# Patient Record
Sex: Female | Born: 1950 | Race: Black or African American | Hispanic: No | Smoking: Former smoker
Health system: Southern US, Community
[De-identification: ages and names within clinical notes are randomized; demographics above are authoritative.]

## PROBLEM LIST (undated history)

## (undated) DIAGNOSIS — K219 Gastro-esophageal reflux disease without esophagitis: Secondary | ICD-10-CM

## (undated) DIAGNOSIS — I10 Essential (primary) hypertension: Secondary | ICD-10-CM

## (undated) DIAGNOSIS — E785 Hyperlipidemia, unspecified: Secondary | ICD-10-CM

## (undated) DIAGNOSIS — N200 Calculus of kidney: Secondary | ICD-10-CM

---

## 2003-07-16 ENCOUNTER — Ambulatory Visit (HOSPITAL_COMMUNITY): Admission: RE | Admit: 2003-07-16 | Discharge: 2003-07-16 | Payer: Self-pay | Admitting: Gastroenterology

## 2003-07-16 ENCOUNTER — Encounter (INDEPENDENT_AMBULATORY_CARE_PROVIDER_SITE_OTHER): Payer: Self-pay | Admitting: Specialist

## 2006-11-20 ENCOUNTER — Ambulatory Visit (HOSPITAL_COMMUNITY): Admission: RE | Admit: 2006-11-20 | Discharge: 2006-11-20 | Payer: Self-pay | Admitting: Gastroenterology

## 2006-11-20 ENCOUNTER — Encounter (INDEPENDENT_AMBULATORY_CARE_PROVIDER_SITE_OTHER): Payer: Self-pay | Admitting: Gastroenterology

## 2007-03-29 ENCOUNTER — Encounter: Admission: RE | Admit: 2007-03-29 | Discharge: 2007-03-29 | Payer: Self-pay | Admitting: Urology

## 2007-03-30 ENCOUNTER — Ambulatory Visit (HOSPITAL_BASED_OUTPATIENT_CLINIC_OR_DEPARTMENT_OTHER): Admission: RE | Admit: 2007-03-30 | Discharge: 2007-03-30 | Payer: Self-pay | Admitting: Urology

## 2007-03-30 ENCOUNTER — Ambulatory Visit (HOSPITAL_COMMUNITY): Admission: RE | Admit: 2007-03-30 | Discharge: 2007-04-01 | Payer: Self-pay | Admitting: Urology

## 2007-04-09 ENCOUNTER — Ambulatory Visit (HOSPITAL_COMMUNITY): Admission: RE | Admit: 2007-04-09 | Discharge: 2007-04-09 | Payer: Self-pay | Admitting: Urology

## 2007-04-25 ENCOUNTER — Ambulatory Visit (HOSPITAL_COMMUNITY): Admission: RE | Admit: 2007-04-25 | Discharge: 2007-04-25 | Payer: Self-pay | Admitting: Urology

## 2007-07-10 ENCOUNTER — Emergency Department (HOSPITAL_COMMUNITY): Admission: EM | Admit: 2007-07-10 | Discharge: 2007-07-10 | Payer: Self-pay | Admitting: Emergency Medicine

## 2007-07-12 ENCOUNTER — Ambulatory Visit (HOSPITAL_COMMUNITY): Admission: RE | Admit: 2007-07-12 | Discharge: 2007-07-12 | Payer: Self-pay | Admitting: Urology

## 2008-03-21 ENCOUNTER — Ambulatory Visit (HOSPITAL_BASED_OUTPATIENT_CLINIC_OR_DEPARTMENT_OTHER): Admission: RE | Admit: 2008-03-21 | Discharge: 2008-03-21 | Payer: Self-pay | Admitting: Urology

## 2008-07-11 ENCOUNTER — Ambulatory Visit (HOSPITAL_BASED_OUTPATIENT_CLINIC_OR_DEPARTMENT_OTHER): Admission: RE | Admit: 2008-07-11 | Discharge: 2008-07-11 | Payer: Self-pay | Admitting: Urology

## 2008-10-30 ENCOUNTER — Ambulatory Visit (HOSPITAL_COMMUNITY): Admission: RE | Admit: 2008-10-30 | Discharge: 2008-10-30 | Payer: Self-pay | Admitting: Urology

## 2009-08-09 IMAGING — CR DG CHEST 2V
2 series · 2 of 2 positions shown · non-contrast
Comparison: 03/29/2007

CLINICAL DATA: Preop, right distal ureteral stone

CHEST - 2 VIEW

[view not recorded (1 of 2)]
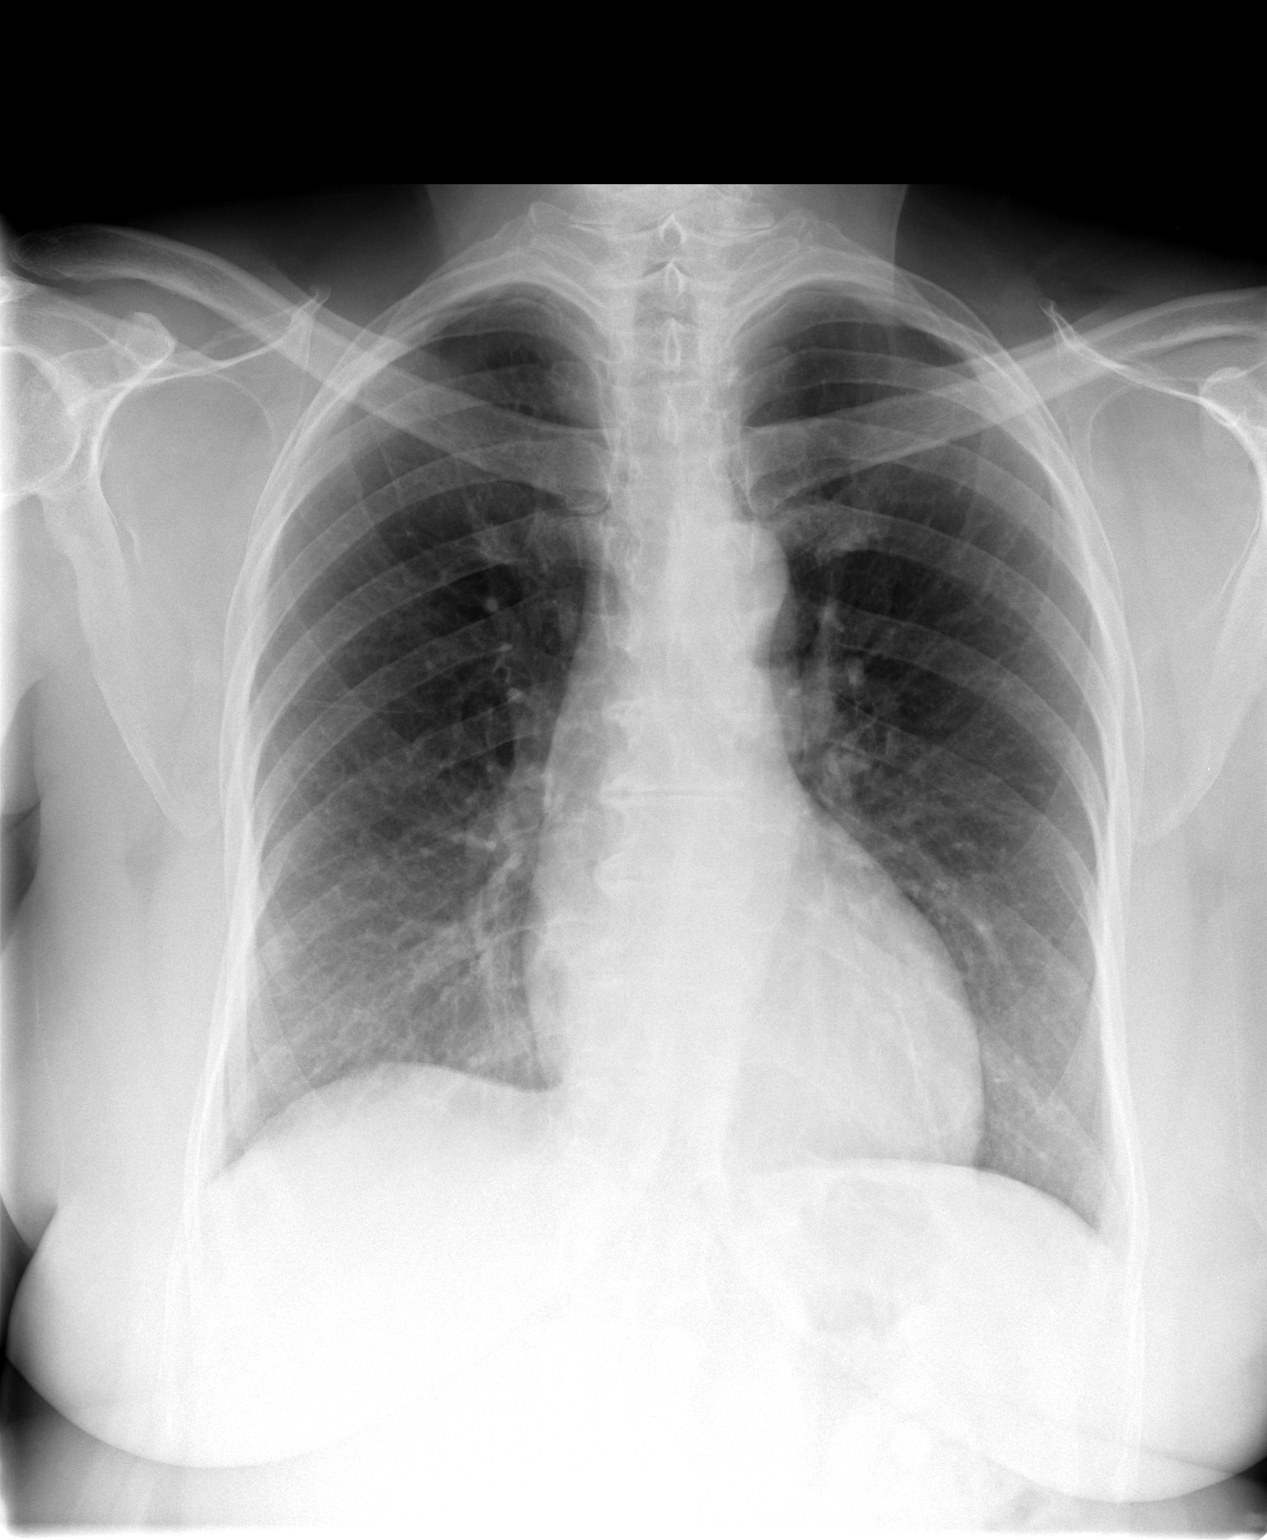

[view not recorded (2 of 2)]
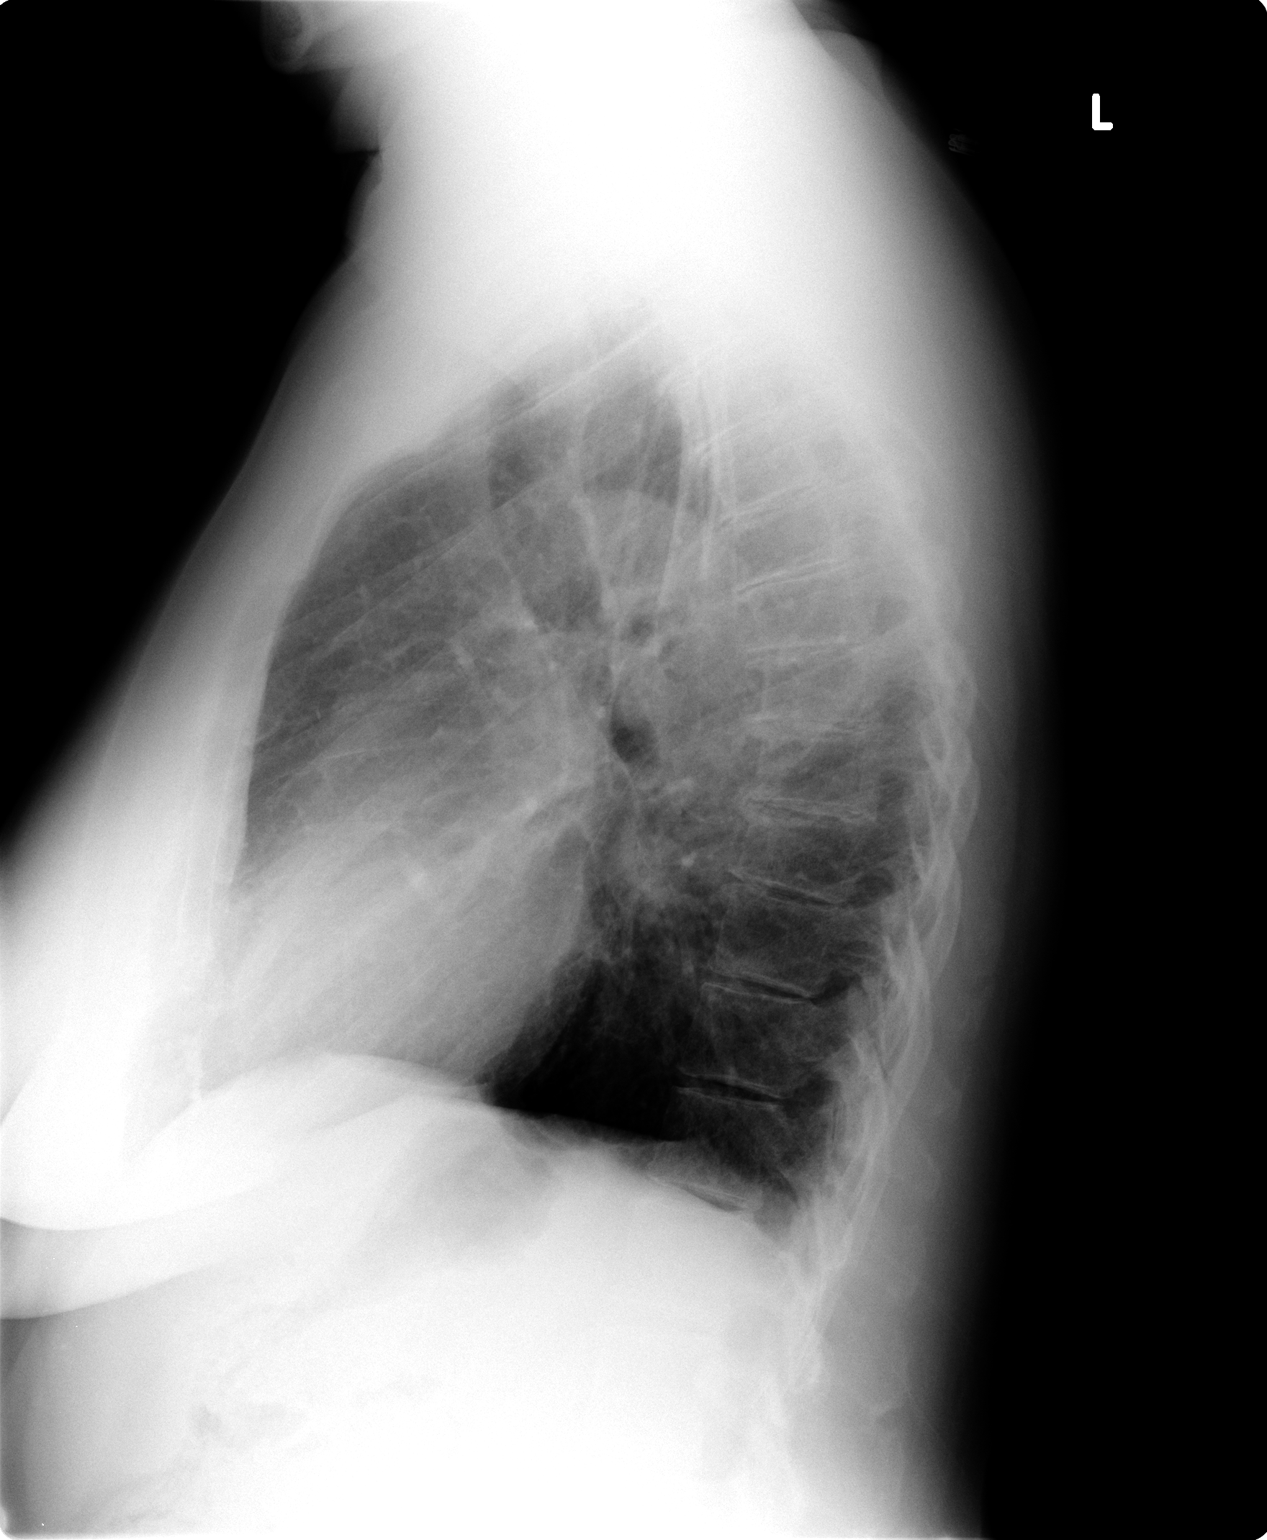

[2 of 2 positions shown; findings below may reference images not displayed]

FINDINGS: Cardiomediastinal silhouette is stable.  No acute
infiltrate or pleural effusion.  No pulmonary edema.  Degenerative
changes thoracic spine are noted.
IMPRESSION: No acute disease.  Degenerative changes thoracic spine.

## 2009-09-28 ENCOUNTER — Ambulatory Visit (HOSPITAL_COMMUNITY): Admission: RE | Admit: 2009-09-28 | Discharge: 2009-09-29 | Payer: Self-pay | Admitting: Urology

## 2010-05-27 ENCOUNTER — Ambulatory Visit (HOSPITAL_COMMUNITY)
Admission: RE | Admit: 2010-05-27 | Discharge: 2010-05-27 | Payer: Self-pay | Source: Home / Self Care | Attending: Urology | Admitting: Urology

## 2010-08-10 LAB — CBC
HCT: 39.5 % (ref 36.0–46.0)
Hemoglobin: 12.9 g/dL (ref 12.0–15.0)
MCHC: 32.6 g/dL (ref 30.0–36.0)
MCV: 80.8 fL (ref 78.0–100.0)
Platelets: 225 10*3/uL (ref 150–400)
RBC: 4.89 MIL/uL (ref 3.87–5.11)
RDW: 13.6 % (ref 11.5–15.5)
WBC: 8.9 10*3/uL (ref 4.0–10.5)

## 2010-09-07 LAB — COMPREHENSIVE METABOLIC PANEL
ALT: 12 U/L (ref 0–35)
Albumin: 3.7 g/dL (ref 3.5–5.2)
Alkaline Phosphatase: 78 U/L (ref 39–117)
Calcium: 8.7 mg/dL (ref 8.4–10.5)
Chloride: 104 mEq/L (ref 96–112)
GFR calc Af Amer: >60 mL/min (ref >60)
GFR calc non Af Amer: >60 mL/min (ref >60)
Glucose, Bld: 104 mg/dL — ABNORMAL HIGH (ref 70–99)

## 2010-09-07 LAB — CBC
MCHC: 32.4 g/dL (ref 30.0–36.0)
RBC: 4.91 MIL/uL (ref 3.87–5.11)
RDW: 15 % (ref 11.5–15.5)
WBC: 5.7 10*3/uL (ref 4.0–10.5)

## 2010-09-07 LAB — APTT: aPTT: 38 seconds — ABNORMAL HIGH (ref 24–37)

## 2010-10-05 NOTE — Op Note (Signed)
Stacy Garrett, Stacy Garrett                 ACCOUNT NO.:  [id number]   MEDICAL RECORD NO.:  [id number]          PATIENT TYPE:  AMB   LOCATION:  [facility]                         FACILITY:  [facility]   PHYSICIAN:  Ronald L. Earlene Plater, M.D.  DATE OF BIRTH:  1951/01/12   DATE OF PROCEDURE:  07/11/2008  DATE OF DISCHARGE:                               OPERATIVE REPORT   DIAGNOSES:  Right nephrolithiasis with ureterolithiasis.   POSTOPERATIVE DIAGNOSES:  Right nephrolithiasis with ureterolithiasis.   OPERATIVE PROCEDURE:  Cystourethroscopy, right retrograde  ureteropyelogram, right flexible and rigid ureterorenoscopy, holmium  laser lithotripsy, basket stone extraction, and placement of right  double-J stent.   SURGEON:  Lucrezia Starch. Earlene Plater, M.D.   ANESTHESIA:  LMA.   ESTIMATED BLOOD LOSS:  Negligible.   TUBES:  24-cm 7-French Contour double pigtail stent.   COMPLICATIONS:  None.   INDICATIONS FOR PROCEDURE:  Ms. Carbonell is a lovely 60 year old black  female who presented with right flank pain, nausea and vomiting.  She  was subsequently found to have an approximately 6-mm fragment blocking  her upper ureter on the right side.  She has been followed with  expulsive therapy utilizing Flomax and fluids, and the stone has not  moved.  She also has significant bilateral nephrolithiasis.  After  continuing for approximately 3 weeks, she has elected to proceed with  the above procedure.   PROCEDURE IN DETAIL:  The patient was placed in the supine position and  after proper LMA anesthesia, was placed in the dorsal lithotomy position  and prepped and draped with Betadine in a sterile fashion.  Cystourethroscopy was performed with the 22.5-French Olympus  panendoscope.  The bladder was examined with 12 and 70-degree lenses,  and was noted to be without lesions.  Both ureteral orifices were in  normal location.  Under fluoroscopic guidance, a 0.03 French sensor wire  was placed in the right renal pelvis.  I could  feel it go by the stone  in the upper ureter.  The ureter was then dilated with the inner  dilating sheath of a short ureteral access sheath, and rigid  ureteroscopy was performed with a 6-French semiflexible ureteroscope.  The stone was seen to flush from the ureteropelvic junction into the  renal caliceal system.  Inspection of the renal pelvis and some  superficial calyces with the scope revealed that I could not see the  fragments.  Therefore, the rigid ureteroscope was removed.  A dual-lumen  inserter was placed and a second 0.03 French sensor wire was placed.  Next, the ureteral access sheath was placed to the upper ureter, and  utilizing the Olympus flexible ureterorenoscope, renoscopy was  performed.  Stone  fragments could be seen in the midline lower pole  caliceal system and several large fragments, one of which we felt was  the flushed fragment from the UPJ.  Utilizing the 200-micron laser  fiber, all visualized stones were fragmented into small fragments and  several small fragments were extracted for stone analysis with the  nitinol basket.  Reinspection revealed there were no significant  fragments, the largest were  approximately 1-2 mm.  No other lesions were  noted to be present.  Dye was injected and a retrograde pyelogram was  performed, and there was no extravasation, although there was  hydronephrosis noted.  The flexible ureterorenoscope was visually  removed, the sheath was removed, and under fluoroscopic guidance a 24-cm  7-French Contour double pigtail stent was placed and noted to be in good  position within the right renal pelvis and within the bladder.  No  pullout string was attached.  Proper location was confirmed  fluoroscopically.  The bladder was drained.  The panendoscope was  removed and the patient was taken to the recovery room stable.      Ronald L. Earlene Plater, M.D.  Electronically Signed     RLD/MEDQ  D:  07/11/2008  T:  07/11/2008  Job:  [id number]

## 2010-10-05 NOTE — Op Note (Signed)
Stacy Garrett, Stacy Garrett                 ACCOUNT NO.:  [id number]   MEDICAL RECORD NO.:  [id number]          PATIENT TYPE:  AMB   LOCATION:  [facility]                         FACILITY[facility]   PHYSICIAN:  Stacy Garrett, M.D.  DATE OF BIRTH:  [birth year]   DATE OF PROCEDURE:  03/21/2008  DATE OF DISCHARGE:                               OPERATIVE REPORT   PREOPERATIVE DIAGNOSIS:  Left ureteral stone.   POSTOPERATIVE DIAGNOSIS:  Left ureteral stone.   PROCEDURES:  1. Cystourethroscopy.  2. Left retrograde pyelogram.  3. Left distal ureteral dilation with access sheath.  4. Left semi-rigid ureteroscopy with laser lithotripsy and basket      extraction of stone fragments.  5. Intraoperative fluoroscopy.  6. Left 6 x 24 contoured double-J stent placement.   ATTENDING PHYSICIAN:  Stacy Garrett.   RESIDENT PHYSICIAN:  Dr. Delman Garrett.   ANESTHESIA:  General.   INDICATIONS FOR PROCEDURE:  Stacy Garrett is a 60 year old white female who  was initially diagnosed with a urinary tract infection by her primary  care physician.  Treatment was begun for this and she was complaining of  right flank pain and had a noncontrast CT scan which showed bilateral  renal calculi and a proximal left ureteral stone that did show any  evidence of what could be causing her right flank pain.  Based on this,  her urinary tract infection was treated with oral antibiotics.  She did  not have a significant amount of discomfort in her left flank, so she  was set up for outpatient elective ureteroscopic stone manipulation.   Prior to the procedure all risks, benefits, consequences and concerns  were discussed and informed consent was obtained.   PROCEDURE IN DETAIL:  The patient was brought to the operating room and  placed in a supine position.  She was correctly identified by her  wristband and an appropriate time-out was taken.  IV antibiotics were  administered and general anesthesia was delivered.  Once adequately  anesthetized, she was placed in the dorsal lithotomy position.  Great  care was taken to minimize the risk of peripheral neuropathy or  compartment syndrome.  Her perineum was prepped and draped sterilely.  We began our procedure by performing cystourethroscopy with a 22-French  rigid cystoscopic sheath, 12-degree lens, and normal saline as our  irrigant.  The urethra was normal in course and caliber.  Upon entering  the bladder, clear urine was identified.  There were no stone fragments  seen in the bladder.  The urothelium was normal.  Both ureteral orifices  were noted to be in their normal anatomic position effluxing clear  urine.  The left ureteral orifice was cannulated with a 6-French open-  ended catheter and left retrograde pyelogram was performed.   Left retrograde pyelogram showed a mobile defect in the distal ureter  that was radio-opaque on fluoroscopy prior to contrast being given.  This was her stone and it was at the level of the UVJ.  There was no  significant hydronephrosis proximal to this.  We then advanced a sensor  tip guidewire through  our open-ended catheter and directed it to the  level of the left renal pelvis with fluoroscopic guidance.  It.  We then  removed the lens from the cystoscope, leaving the sheath behind.  We  then used the 12-French inner dilating sheath from a ureteral access  sheath to gently dilate up the distal ureter.  This was done with  fluoroscopic guidance as well.  We then removed both the sheath and the  access sheath dilator and left the guidewire in place.  We then used a  7.5-French semi-rigid ureteroscope and easily cannulated the left distal  ureter and soon encountered our stone.  We initially grasped it with a  nitinol basket and tried to remove it en bloc, however, it was too large  and there was resistance.  Likewise, we disengaged the basket from the  stone and advanced our 360 nanometer laser fiber through the  ureteroscope and  performed laser lithotripsy with the holmium laser with  settings at 0.5 joules and 5 Hz.  We systematically dusted this stone.  We were able to grasp the larger fragments with a nitinol grasper and  dropped them into the bladder.  Final ureteroscopic evaluation  demonstrated no residual stone in her ureter.  We then removed the  ureteroscope and placed a left 6 x 24 contoured double-J stent without  any difficulty.  Fluoroscopy demonstrated the proximal curl was in the  left renal pelvis and the distal curl was in the bladder.  We then re-  placed the cystoscope into her bladder, verified the position of the  stent.  There was urine emanating from the vents, indicating function.  All of the stone debris was in the dependent part of her bladder and  this was removed through the cystoscope without any difficulty.  The  stone fragments will be sent for analysis.  Her bladder was drained, and  this concluded our procedure.  She tolerated the procedure well.  There  were no complications.  She awoke from anesthesia and was taken to the  PACU in stable condition.  Dr. [name] was present and participated in all  aspects of the case.     ______________________________  Stacy Kitten, MD      Stacy Garrett, M.D.  Electronically Signed    DW/MEDQ  D:  03/21/2008  T:  03/21/2008  Job:  [id number]

## 2010-10-05 NOTE — Op Note (Signed)
NAME:  Stacy Garrett, Stacy Garrett                 ACCOUNT NO.:  [id number]   MEDICAL RECORD NO.:  [id number]          PATIENT TYPE:  AMB   LOCATION:  ENDO                         FACILITY:  [facility]   PHYSICIAN:  Anselmo Rod, M.D.  DATE OF BIRTH:  [birth year]   DATE OF PROCEDURE:  11/20/2006  DATE OF DISCHARGE:                               OPERATIVE REPORT   PROCEDURE PERFORMED:  Colonoscopy with snare polypectomy x 1 and cold  biopsies x 1.   ENDOSCOPIST:  Anselmo Rod, M.D.   INSTRUMENT USED:  Pentax video colonoscope.   INDICATIONS FOR PROCEDURE:  A 60 year old [place] American female with a  family history of colon cancer in her mother.  Undergoing screening  colonoscopy to rule out colonic polyps, masses, etcetera.   PREPROCEDURE PREPARATION:  Informed consent was procured from the  patient. The patient was fasted for 8 hours prior to the procedure and  prepped with 32 OsmoPrep pills the night prior to the procedure. The  risks and benefits of the procedure including a 10% missed rate of  cancer and polyps were discussed with the patient as well.   PREPROCEDURE PHYSICAL:  VITAL SIGNS: The patient with stable vital  signs.  NECK:  Supple.  CHEST:  Clear to auscultation.  HEART:  S1, S2 regular.  ABDOMEN:  Soft with normal bowel sounds.   DESCRIPTION OF THE PROCEDURE:  The patient was placed in left lateral  decubitus position and sedated with 100 mcg of Fentanyl and 10 mg of  Versed given intravenously in slow incremental doses. Once the patient  was adequately sedated and maintained on low-flow oxygen and continuous  cardiac monitoring;  the Pentax video colonoscope was advanced from the  rectum to the cecum without difficulty.  The left colon and the  transverse colon seemed well cleaned-out, but there was some residual  stool in the right colon. There was a large diverticulum with  inspissated in it, noted just distal to the cecum.  A small sessile  polyp was removed by cold  snare from the cecum; and one small sessile  polyp was biopsied (stool biopsies x1) from the cecum as well. There  were a few early right-sided diverticula noted.  The terminal ileum  appeared healthy and without lesions.  The transverse colon and the left  colon including the rectosigmoid colon appeared normal. Retroflexion in  the rectum revealed small internal hemorrhoids. There were small  external hemorrhoids on anal inspection.  Multiple washes were done on  the right side, small lesions could be missed.   IMPRESSION:  1. Two polyps removed from the cecum, one by cold snare, one by cold      biopsy.  2. Right-sided diverticulosis.  3. Small internal and external hemorrhoids.  4. Normal terminal ileum.   RECOMMENDATIONS:  1. Await pathology results.  2. Avoid all nonsteroidals including aspirin for the next 2 weeks.  3. Repeat colonoscopy depending on pathology results.  4. A high fiber diet has been advised and brochures have been given      for education along with brochures on diverticulosis.  5. Outpatient follow up as need arises in the future.      Anselmo Rod, M.D.  Electronically Signed     JNM/MEDQ  D:  11/20/2006  T:  11/20/2006  Job:  [id number]   cc:   Gabriel Earing, M.D.  Fax: [phone]

## 2010-10-05 NOTE — Op Note (Signed)
Stacy Garrett, Stacy Garrett                 ACCOUNT NO.:  [id number]   MEDICAL RECORD NO.:  [id number]          PATIENT TYPE:  AMB   LOCATION:  DAY                          FACILITY:  [facility]   PHYSICIAN:  Terie Purser, MD         DATE OF BIRTH:  03/16/1951   DATE OF PROCEDURE:  04/25/2007  DATE OF DISCHARGE:                               OPERATIVE REPORT   RESIDENT:  Dr. Rachel Bo.   PREOPERATIVE DIAGNOSIS:  Right impacted ureteral stone.   POSTOPERATIVE DIAGNOSIS:  Right impacted ureteral stone.   OPERATION PERFORMED:  1. Pancystourethroscopy.  2. Removal of right ureteral stent.  3. Right laser lithotripsy of impacted right ureteral stone.  4. Right basket stone extraction.  5. Right retrograde pyelogram.  6. Right ureteral stent placement.   SURGEON:  Lucrezia Starch. Earlene Plater, M.D.   ANESTHESIA:   INDICATIONS FOR PROCEDURE:  This is a 60 year old female who initially  presented with a right impacted ureteral stone.  Given that she was ill  at the time of presentation, a stent was attempted to be placed for  drainage of her kidney; however, attempts were unsuccessful, given stone  impaction.  Ultimately, a nephrostomy tube was placed on the right side  and an antegrade stent.  Giving time for sufficient dilation, she has  been brought back electively for right-sided laser lithotripsy of the  stone.   DESCRIPTION OF PROCEDURE:  The patient was brought back into the  operating room.  After the successful induction of anesthetic,  performing a preoperative time out, and receiving preoperative  antibiotics, the patient was placed in the dorsal lithotomy position.  Please note all pressure points were padded appropriately.  She was then  prepped and draped in the usual sterile fashion.   A 22 [place] and a 12.5 degree lenses were used to do  pancystourethroscopy.  The patient's urethra appeared normal.  There was  no evidence of stricture, foreign body, tumor or any other abnormality.  On  entering the patient's bladder, a stent could be seen emanating from  the right ureteral orifice.  Both the right and left ureteral orifices  were at the lateral aspects of the trigone effluxing clear urine.  There  were no other bladder abnormalities.  Specifically no stones, no foreign  bodies, no tumors or diverticula.  At this point the stent was grasped  and the distal portion of the stent was brought out of the urethral  meatus.  It was cannulated with a Glide wire.  With the Glide wire in  the upper collecting system, verified with fluoroscopy, the stent was  removed.  The Glide wire was used as a safety wire and secured to the  patient's draping.  At this point a semirigid ureteroscope was used.   With the semirigid ureteroscope, ureteroscopy was performed.  An area of  impaction along the upper portion of the ureter with a stone just  proximal to this was verified.  With some manipulation, we were able to  guide laser into the field and systematically, we lasered the stone to  break it up into smaller pieces.  Once we had broken up the stone into  adequate pieces, we used a basket to extract the stones from the ureter.  Multiple passes were taken to remove all stones.  Some were sent for  analysis.   Once basketing had been completed, we advanced the semirigid  ureteroscope up to the level of the pelvis into the upper caliceal  collecting system.  We saw some small submillimeter fragments of sand  from breaking up the stone but there were no large stone fragments  visible in the upper collecting system and the caliceal system around  the ureter.   At this point we turned our attention to the retrograde pyelogram.  After removing the scope, the end hole catheter was advanced over the  safety Glide wire.  A retrograde pyelogram was shot.  There was no  evidence of filling defects or extravasation.  At this point the wire  was reinserted.  End hole catheter was removed and a 6 x  24 French stent  was placed.   The 6 x 24 French stent was advanced over the [employer] wire.  Good proximal  and distal curls were seen.  These were viewed fluoroscopically.  The  bladder was emptied.  All stone fragments in the bladder were removed  and this ended the procedure.  Please note that Dr. Earlene Plater was present  throughout the entirety of the case.  The estimated blood loss was  minimal.   URINE OUTPUT:  None recorded.   DRAINS:  6 x 24 French right ureteral stent.   COMPLICATIONS:  None apparent.   DISPOSITION:  The patient was brought to PACU for further care.      Terie Purser, MD     JH/MEDQ  D:  04/25/2007  T:  04/26/2007  Job:  [id number]

## 2010-10-05 NOTE — Op Note (Signed)
NAMESAQUOIA, SIANEZ                 ACCOUNT NO.:  [id number]   MEDICAL RECORD NO.:  [id number]          PATIENT TYPE:  AMB   LOCATION:  XRAY                         [facility]ACILITY:  [facility]   PHYSICIAN:  Ronald L. Earlene Plater, M.D.  DATE OF BIRTH:  August 05, 1950   DATE OF PROCEDURE:  03/30/2007  DATE OF DISCHARGE:                               OPERATIVE REPORT   PREOPERATIVE DIAGNOSIS:  Right ureterolithiasis with hydronephrosis and  a urinary tract infection.   POSTOPERATIVE DIAGNOSIS:  Right ureterolithiasis with hydronephrosis and  a urinary tract infection.   PROCEDURE:  Cystourethroscopy, right retrograde ureteral pyelogram.   SURGEON:  Ronald L. Earlene Plater, M.D.   ANESTHESIA:  LMA.   BLOOD LOSS:  Negligible.   TUBES:  None.   COMPLICATIONS:  None.   FINDINGS:  Impacted stones at L2 level could not be traversed.   PROCEDURE:  Ms. Mcgaughey is a lovely 60 year old black female who has  passed stones and has bilateral nephrolithiasis.  She had been having  hematuria for approximately 2 weeks without nausea.  She was  subsequently found to have a urinary tract infection which was treated,  and she underwent a CT scan which revealed two stones with a marked  hydroureteronephrosis with a 5 and a 6-mm impacted stone.  After  understanding the risks, benefits, and alternatives, she elected to  undergo the above procedure with attempted ureteroscopy.   PROCEDURE IN DETAIL:  The patient was placed in the supine position.  After proper LMA anesthesia, she was placed in the dorsal lithotomy  position with exaggerated lithotomy and prepped and draped with Betadine  in a sterile fashion.   Cystourethroscopy was performed with a 22.5 [place] Olympus panendoscope.  The bladder was carefully inspected and noted be without lesions.  Efflux of urine was noted from the left ureteral orifice.  A sensor wire  was placed up to the right stones which could be seen on fluoroscopy,  and it could not be passed beyond  it.  A 6-French open-ended catheter  was passed up it.  Dye was injected, and no dye could traverses the  stones.  Xylocaine jelly was then injected.  A Glidewire was utilized,  and a Patent attorney catheter was utilized, and nothing could be  traversed beyond the stone.  It was felt to be highly impacted, and any  further manipulation would be dangerous.   The patient tolerated the procedure well and was taken to the recovery  room stable.  Plan is to perform a right percutaneous nephrostomy today  and possibly an antegrade double-J stent and attempt ureteroscopy at a  later date versus open ureterolithotomy.      Ronald L. Earlene Plater, M.D.  Electronically Signed     RLD/MEDQ  D:  03/30/2007  T:  03/30/2007  Job:  [id number]

## 2010-10-08 NOTE — Op Note (Signed)
NAME:  Stacy Garrett, Stacy Garrett                             ACCOUNT NO.:  [id number]   MEDICAL RECORD NO.:  [id number]                   PATIENT TYPE:  AMB   LOCATION:  ENDO                                 FACILITY:  [facility]   PHYSICIAN:  Anselmo Rod, M.D.               DATE OF BIRTH:  04/18/1951   DATE OF PROCEDURE:  07/16/2003  DATE OF DISCHARGE:                                 OPERATIVE REPORT   PROCEDURE PERFORMED:  Colonoscopy with left colon biopsies.   ENDOSCOPIST:  Charna Elizabeth, M.D.   INSTRUMENT USED:  Olympus video colonoscope.   INDICATIONS FOR PROCEDURE:  The patient is a 60 year old African-American  female undergoing screening colonoscopy.  The patient has had problem with  rectal bleeding and constipation.  Rule out colonic polyps, masses, etc.   PREPROCEDURE PREPARATION:  Informed consent was procured from the patient.  The patient was fasted for eight hours prior to the procedure and prepped  with a bottle of magnesium citrate and a gallon of GoLYTELY the night prior  to the procedure.   PREPROCEDURE PHYSICAL:  The patient had stable vital signs.  Neck supple.  Chest clear to auscultation.  S1 and S2 regular.  Abdomen soft with normal  bowel sounds.   DESCRIPTION OF PROCEDURE:  The patient was placed in left lateral decubitus  position and sedated with 70 mg of Demerol and 7 mg of Versed in slow  incremental doses.  Once the patient was adequately sedated and maintained  on low flow oxygen and continuous cardiac monitoring, the Olympus video  colonoscope was advanced from the rectum to the cecum with some difficulty.  There was some residual stool in the colon and multiple washes were done.  No masses, polyps, erosions, ulcerations or diverticula were seen.  There  was mild friability of the mucosa in the left colon with loss of vascular  markings.  Biopsies were done to rule out colitis.  Small internal  hemorrhoids were seen on retroflexion.  The appendicular orifice  and  ileocecal valve were clearly visualized.  An isolated diverticulum was seen  in the right colon with inspissated stool in the diverticula.  The terminal  ileum appeared healthy without lesions.  The patient tolerated the procedure  well without immediate complications.   IMPRESSION:  1. Mild friability with loss of vascular markings in the left colonic     mucosa, biopsies done.  2. Small internal hemorrhoids.  3. Isolated diverticulum in midright colon with inspissated stool.  4. Normal terminal ileum.  5. No masses or polyps seen.   RECOMMENDATIONS:  1. Continue high fiber diet with liberal fluid intake.  2. Outpatient follow-up in the next two weeks to follow up on pathology     results.  Further recommendations to be made thereafter.  Anselmo Rod, M.D.    JNM/MEDQ  D:  07/16/2003  T:  07/16/2003  Job:  [id number]   cc:   Gabriel Earing, M.D.  [address]  [place]  [place] [address]  Fax: [phone]

## 2011-02-11 LAB — COMPREHENSIVE METABOLIC PANEL WITH GFR
ALT: 18
AST: 24
CO2: 22
Chloride: 102
GFR calc Af Amer: >60
GFR calc non Af Amer: 58 — ABNORMAL LOW
Sodium: 138
Total Bilirubin: 0.5

## 2011-02-11 LAB — BASIC METABOLIC PANEL
BUN: 16
Calcium: 8.7
Chloride: 104
Creatinine, Ser: 0.85
GFR calc Af Amer: >60

## 2011-02-11 LAB — DIFFERENTIAL
Basophils Absolute: 0
Basophils Relative: 0
Eosinophils Absolute: 0.1
Eosinophils Relative: 1
Lymphocytes Relative: 18
Lymphs Abs: 2.1
Monocytes Absolute: 0.4
Monocytes Relative: 4
Neutro Abs: 9.3 — ABNORMAL HIGH
Neutrophils Relative %: 78 — ABNORMAL HIGH

## 2011-02-11 LAB — URINALYSIS, ROUTINE W REFLEX MICROSCOPIC
Bilirubin Urine: NEGATIVE
Glucose, UA: NEGATIVE
Ketones, ur: NEGATIVE
Nitrite: NEGATIVE
Protein, ur: NEGATIVE
Specific Gravity, Urine: 1.017
Urobilinogen, UA: 0.2
pH: 6.5

## 2011-02-11 LAB — CBC
HCT: 42.2
Hemoglobin: 14.2
MCHC: 33.8
MCV: 78.2
Platelets: 232
RBC: 5.4 — ABNORMAL HIGH
RDW: 14.4
WBC: 11.9 — ABNORMAL HIGH

## 2011-02-11 LAB — URINE MICROSCOPIC-ADD ON

## 2011-02-11 LAB — COMPREHENSIVE METABOLIC PANEL
Albumin: 4
Alkaline Phosphatase: 90
BUN: 17
Calcium: 9.2
Creatinine, Ser: 0.99
Glucose, Bld: 129 — ABNORMAL HIGH
Potassium: 2.9 — ABNORMAL LOW
Total Protein: 8.1

## 2011-02-21 LAB — POCT I-STAT 4, (NA,K, GLUC, HGB,HCT)
Hemoglobin: 13.6
Sodium: 144

## 2011-02-28 LAB — HEMOGLOBIN AND HEMATOCRIT, BLOOD
HCT: 39.2
Hemoglobin: 13.2

## 2011-02-28 LAB — BASIC METABOLIC PANEL
CO2: 29
Chloride: 102
GFR calc Af Amer: >60
Potassium: 3.3 — ABNORMAL LOW

## 2011-03-01 LAB — CBC
HCT: 39.7
Hemoglobin: 11.5 — ABNORMAL LOW
Hemoglobin: 13
MCHC: 32.6
RBC: 5
RDW: 13.5

## 2011-03-01 LAB — COMPREHENSIVE METABOLIC PANEL
ALT: 26
Alkaline Phosphatase: 109
CO2: 34 — ABNORMAL HIGH
Chloride: 102
GFR calc non Af Amer: >60
Glucose, Bld: 111 — ABNORMAL HIGH
Potassium: 4
Sodium: 144
Total Bilirubin: 0.4

## 2011-03-01 LAB — BASIC METABOLIC PANEL
BUN: 18
CO2: 29
Calcium: 8.2 — ABNORMAL LOW
Glucose, Bld: 94
Sodium: 143

## 2011-03-01 LAB — URINALYSIS, ROUTINE W REFLEX MICROSCOPIC
Glucose, UA: NEGATIVE
Ketones, ur: NEGATIVE
Nitrite: NEGATIVE
Protein, ur: NEGATIVE
Urobilinogen, UA: 0.2

## 2011-06-25 IMAGING — CR DG ABDOMEN 1V
1 series · 1 of 1 positions shown · non-contrast
Comparison: Abdominal radiograph performed 10/30/2008

CLINICAL DATA: Pre lithotripsy radiograph for right ureteral stone.

ABDOMEN - 1 VIEW

[t abdomen supine]
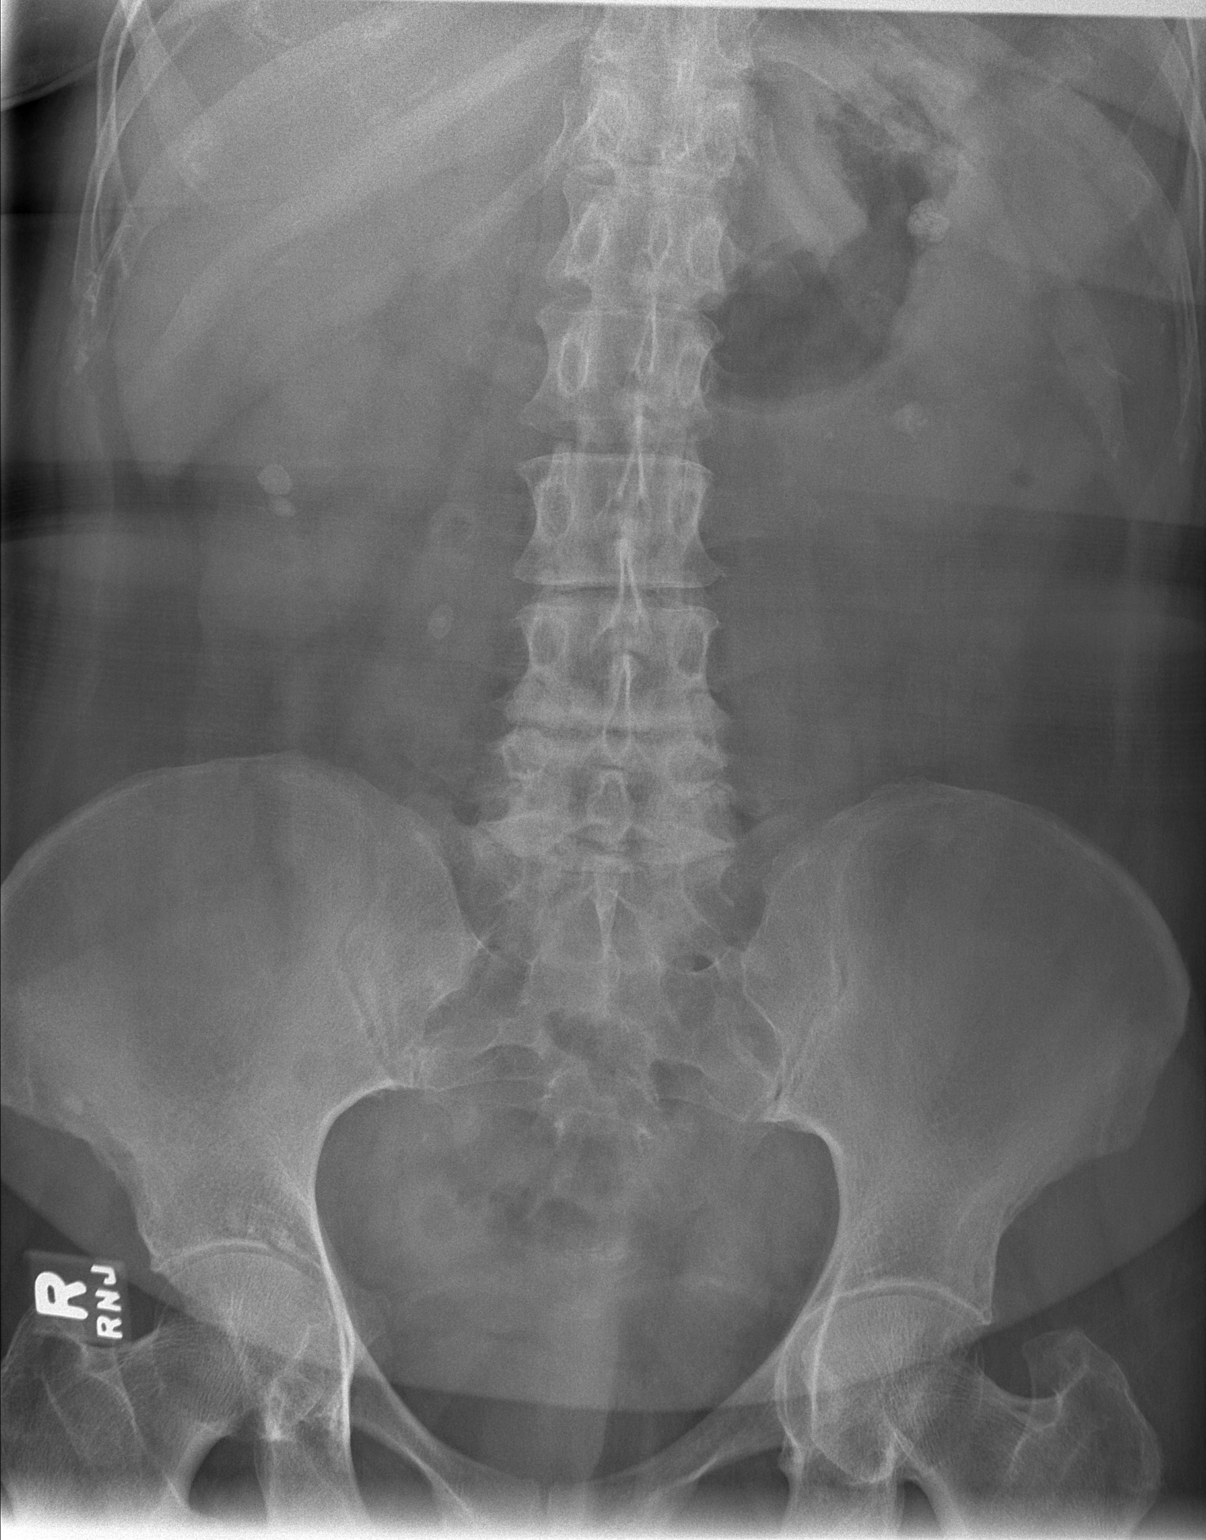

[1 of 1 positions shown; findings below may reference images not displayed]

FINDINGS: Bilateral renal stones are noted, measuring 0.9 cm and
0.7 cm on the right, and 1.2 cm, 1.0 cm and 0.2 cm on the left.
There is an apparent stone at the inferior right renal pelvis,
measuring 1.8 cm in size.  There is also a 0.9 cm stone overlying
the expected location of the proximal right ureter.

The visualized bowel gas pattern is grossly unremarkable; there is
a paucity of bowel gas within the abdomen.  A small amount of air
is noted within the stomach.

Mild degenerative change is noted at the lower lumbar spine.  No
acute osseous abnormalities are seen.
IMPRESSION: 1.  Apparent 1.8 cm stone at the inferior right renal pelvis, and
0.9 cm stone overlying the expected location of the proximal right
ureter.
2.  Bilateral renal stones noted.

## 2014-03-12 ENCOUNTER — Emergency Department (HOSPITAL_BASED_OUTPATIENT_CLINIC_OR_DEPARTMENT_OTHER)
Admission: EM | Admit: 2014-03-12 | Discharge: 2014-03-12 | Disposition: A | Discharge status: Home / Self Care | Payer: BC Managed Care – PPO | Attending: Emergency Medicine | Admitting: Emergency Medicine

## 2014-03-12 ENCOUNTER — Encounter (HOSPITAL_BASED_OUTPATIENT_CLINIC_OR_DEPARTMENT_OTHER): Payer: Self-pay | Admitting: Emergency Medicine

## 2014-03-12 DIAGNOSIS — Z79899 Other long term (current) drug therapy: Secondary | ICD-10-CM | POA: Diagnosis not present

## 2014-03-12 DIAGNOSIS — R55 Syncope and collapse: Secondary | ICD-10-CM | POA: Insufficient documentation

## 2014-03-12 DIAGNOSIS — Z791 Long term (current) use of non-steroidal anti-inflammatories (NSAID): Secondary | ICD-10-CM | POA: Diagnosis not present

## 2014-03-12 DIAGNOSIS — Z792 Long term (current) use of antibiotics: Secondary | ICD-10-CM | POA: Diagnosis not present

## 2014-03-12 DIAGNOSIS — Z87891 Personal history of nicotine dependence: Secondary | ICD-10-CM | POA: Diagnosis not present

## 2014-03-12 DIAGNOSIS — Y9389 Activity, other specified: Secondary | ICD-10-CM | POA: Diagnosis not present

## 2014-03-12 DIAGNOSIS — Y9289 Other specified places as the place of occurrence of the external cause: Secondary | ICD-10-CM | POA: Diagnosis not present

## 2014-03-12 DIAGNOSIS — Z87442 Personal history of urinary calculi: Secondary | ICD-10-CM | POA: Diagnosis not present

## 2014-03-12 DIAGNOSIS — T782XXA Anaphylactic shock, unspecified, initial encounter: Secondary | ICD-10-CM

## 2014-03-12 DIAGNOSIS — I10 Essential (primary) hypertension: Secondary | ICD-10-CM | POA: Diagnosis not present

## 2014-03-12 DIAGNOSIS — T368X5A Adverse effect of other systemic antibiotics, initial encounter: Secondary | ICD-10-CM | POA: Diagnosis not present

## 2014-03-12 DIAGNOSIS — Z8639 Personal history of other endocrine, nutritional and metabolic disease: Secondary | ICD-10-CM | POA: Insufficient documentation

## 2014-03-12 DIAGNOSIS — Z87440 Personal history of urinary (tract) infections: Secondary | ICD-10-CM | POA: Diagnosis not present

## 2014-03-12 DIAGNOSIS — K219 Gastro-esophageal reflux disease without esophagitis: Secondary | ICD-10-CM | POA: Diagnosis not present

## 2014-03-12 DIAGNOSIS — R21 Rash and other nonspecific skin eruption: Secondary | ICD-10-CM | POA: Diagnosis present

## 2014-03-12 DIAGNOSIS — T886XXA Anaphylactic reaction due to adverse effect of correct drug or medicament properly administered, initial encounter: Secondary | ICD-10-CM | POA: Diagnosis not present

## 2014-03-12 HISTORY — DX: Hyperlipidemia, unspecified: E78.5

## 2014-03-12 HISTORY — DX: Calculus of kidney: N20.0

## 2014-03-12 HISTORY — DX: Essential (primary) hypertension: I10

## 2014-03-12 HISTORY — DX: Gastro-esophageal reflux disease without esophagitis: K21.9

## 2014-03-12 LAB — URINALYSIS, ROUTINE W REFLEX MICROSCOPIC
GLUCOSE, UA: NEGATIVE mg/dL
KETONES UR: 15 mg/dL — AB
NITRITE: NEGATIVE
PH: 5.5 (ref 5.0–8.0)
Protein, ur: 100 mg/dL — AB
SPECIFIC GRAVITY, URINE: 1.022 (ref 1.005–1.030)
Urobilinogen, UA: 1 mg/dL (ref 0.0–1.0)

## 2014-03-12 LAB — URINE MICROSCOPIC-ADD ON

## 2014-03-12 MED ORDER — EPINEPHRINE HCL 1 MG/ML IJ SOLN
INTRAMUSCULAR | Status: AC
  Administered 2014-03-12: 1 mg
  Filled 2014-03-12: qty 1

## 2014-03-12 MED ORDER — PREDNISONE 50 MG PO TABS
60.0000 mg | ORAL_TABLET | Freq: Once | ORAL | Status: AC
  Administered 2014-03-12: 60 mg via ORAL
  Filled 2014-03-12 (×2): qty 1

## 2014-03-12 MED ORDER — DIPHENHYDRAMINE HCL 25 MG PO TABS: 25.0000 mg | ORAL_TABLET | Freq: Four times a day (QID) | ORAL | Status: AC

## 2014-03-12 MED ORDER — PREDNISONE 10 MG PO TABS: 60.0000 mg | ORAL_TABLET | Freq: Every day | ORAL | Status: AC

## 2014-03-12 MED ORDER — EPINEPHRINE 0.3 MG/0.3ML IJ SOAJ
0.3000 mg | Freq: Once | INTRAMUSCULAR | Status: DC
  Filled 2014-03-12: qty 0.6

## 2014-03-12 MED ORDER — CEPHALEXIN 500 MG PO CAPS: 500.0000 mg | ORAL_CAPSULE | Freq: Two times a day (BID) | ORAL | Status: AC

## 2014-03-12 MED ORDER — SODIUM CHLORIDE 0.9 % IV BOLUS (SEPSIS)
1000.0000 mL | Freq: Once | INTRAVENOUS | Status: AC
  Administered 2014-03-12: 1000 mL via INTRAVENOUS

## 2014-03-12 MED ORDER — FAMOTIDINE 20 MG PO TABS: 20.0000 mg | ORAL_TABLET | Freq: Two times a day (BID) | ORAL | Status: AC

## 2014-03-12 MED ORDER — EPINEPHRINE 0.3 MG/0.3ML IJ SOAJ: 0.3000 mg | Freq: Once | INTRAMUSCULAR | Status: AC

## 2014-03-12 MED ORDER — FAMOTIDINE IN NACL 20-0.9 MG/50ML-% IV SOLN
20.0000 mg | Freq: Once | INTRAVENOUS | Status: AC
  Administered 2014-03-12: 20 mg via INTRAVENOUS
  Filled 2014-03-12: qty 50

## 2014-03-12 NOTE — ED Notes (Signed)
Pt reports HA as well. Reports that she has had HA all day, but has progressively gotten worse.

## 2014-03-12 NOTE — ED Provider Notes (Signed)
CSN: [id number]     Arrival date & time 03/12/14  [id number] History  This chart was scribed for Stacy Garrett Orlando Thalmann, MD by Evon Slackerrance Branch, ED Scribe. This patient was seen in room MH10/MH10 and the patient's care was started at 6:32 PM.      Chief Complaint  Patient presents with  . Allergic Reaction   Patient is a 63 y.o. female presenting with allergic reaction. The history is provided by the patient. No language interpreter was used.  Allergic Reaction Presenting symptoms: difficulty breathing, itching and rash   Severity:  Moderate Context: medications   Relieved by:  Antihistamines Worsened by:  Nothing tried Ineffective treatments:  Rest  HPI Comments: Stacy Garrett is a 63 y.o. female who presents to the Emergency Department complaining of allergic reaction onset 2:30 PM. She states she went to doctor for UTI and Kidney stones today and received bactrim. She states that she took the bactrim then took a nap and about 2 hours later noticed itching in her hands that spread all over. She states shortly after she started to turn red and develop hives all over. She states when returning to the doctors office she had trouble breathing and a syncope episode in the elevator. She state she received a shot of benadryl that has provided some relief.    Past Medical History  Diagnosis Date  . Hypertension   . GERD (gastroesophageal reflux disease)   . Hyperlipidemia   . Kidney stones    History reviewed. No pertinent past surgical history. No family history on file. History  Substance Use Topics  . Smoking status: Former Games developermoker  . Smokeless tobacco: Not on file  . Alcohol Use: Not on file   OB History   Grav Para Term Preterm Abortions TAB SAB Ect Mult Living                 Review of Systems  Skin: Positive for itching and rash.  Neurological: Positive for syncope.  All other systems reviewed and are negative.   Allergies  Bactrim  Home Medications   Prior to Admission medications    Medication Sig Start Date End Date Taking? Authorizing Provider  cetirizine (ZYRTEC) 10 MG tablet Take 10 mg by mouth daily.   Yes Historical Provider, MD  fluticasone (FLONASE) 50 MCG/ACT nasal spray Place into both nostrils daily.   Yes Historical Provider, MD  lisinopril-hydrochlorothiazide (PRINZIDE,ZESTORETIC) 20-25 MG per tablet Take 1 tablet by mouth daily.   Yes Historical Provider, MD  meloxicam (MOBIC) 15 MG tablet Take 15 mg by mouth daily.   Yes Historical Provider, MD  metoprolol succinate (TOPROL-XL) 100 MG 24 hr tablet Take 200 mg by mouth daily. Take with or immediately following a meal.   Yes Historical Provider, MD  omeprazole (PRILOSEC) 40 MG capsule Take 40 mg by mouth daily.   Yes Historical Provider, MD   Triage Vitals: BP 106/59  Pulse 72  Temp(Src) 97.6 F (36.4 C) (Oral)  Resp 18  Ht 5\' 6"  (1.676 m)  Wt 179 lb (81.194 kg)  BMI 28.91 kg/m2  SpO2 100%  Physical Exam  Nursing note and vitals reviewed. Constitutional: She is oriented to person, place, and time. She appears well-developed and well-nourished. No distress.  HENT:  Head: Normocephalic and atraumatic.  Mouth/Throat: Oropharynx is clear and moist.  Eyes: Conjunctivae are normal. Pupils are equal, round, and reactive to light. No scleral icterus.  Neck: Neck supple.  Cardiovascular: Normal rate, regular rhythm, normal heart sounds and  intact distal pulses.   No murmur heard. Pulmonary/Chest: Effort normal and breath sounds normal. No stridor. No respiratory distress. She has no rales.  Abdominal: Soft. Bowel sounds are normal. She exhibits no distension. There is no tenderness.  Musculoskeletal: Normal range of motion.  Neurological: She is alert and oriented to person, place, and time.  Skin: Skin is warm and dry. No rash noted.  Flushed skin. Mild excoriations.  Psychiatric: She has a normal mood and affect. Her behavior is normal.    ED Course  Procedures (including critical care  time) DIAGNOSTIC STUDIES: Oxygen Saturation is 100% on RA, normla by my interpretation.    COORDINATION OF CARE: 6:44 PM-Discussed treatment plan which includes UA and epinephrine shot with pt at bedside and pt agreed to plan.      Labs Review Labs Reviewed  URINALYSIS, ROUTINE W REFLEX MICROSCOPIC - Abnormal; Notable for the following:    Color, Urine AMBER (*)    APPearance TURBID (*)    Hgb urine dipstick LARGE (*)    Bilirubin Urine SMALL (*)    Ketones, ur 15 (*)    Protein, ur 100 (*)    Leukocytes, UA LARGE (*)    All other components within normal limits  URINE MICROSCOPIC-ADD ON - Abnormal; Notable for the following:    Squamous Epithelial / LPF FEW (*)    Bacteria, UA FEW (*)    All other components within normal limits  URINE CULTURE    Imaging Review No results found.   EKG Interpretation None      MDM   Final diagnoses:  Anaphylaxis, initial encounter      63 year old female who was just started on Bactrim for a urinary tract infection who presents with hives, itching, and syncope. The setting of a new medication, this likely represents anaphylaxis. She received Benadryl prior to arrival. Will give her IM epinephrine, IV Pepcid, and prednisone. She will require observation for several hours. We'll also check a urinalysis to see whether she needs to be started on a different antibiotic.  11:12 PM symptoms resolved. Observed in ED for 4 hours with no recurrence of symptoms. Stable for discharge home.  Keflex for her UTI. EpiPen prescribed.  I personally performed the services described in this documentation, which was scribed in my presence. The recorded information has been reviewed and is accurate.      Stacy Garrett Bich Mchaney, MD 03/12/14 [phone]

## 2014-03-12 NOTE — ED Notes (Signed)
Arrived via [facility] pt states she was seen at MD office today with c/o UTI and was put on bactrim. Pt took 1 dose of bactrim and 1 hour ago became itchy and rash all over. No airway involvement. C/o h/a and tingling in fingers.

## 2014-03-12 NOTE — ED Notes (Signed)
Pts sister, Robbi Garterancy Kyles, is leaving now for a while but will be coming back to take pt home.  She can be called at [phone].

## 2014-03-12 NOTE — Discharge Instructions (Signed)

## 2014-03-12 NOTE — ED Notes (Signed)
Pt denies itching,rash or diff breathing

## 2014-03-12 NOTE — ED Notes (Signed)
MD at bedside. 

## 2014-03-16 LAB — URINE CULTURE: Colony Count: >=100000

## 2014-03-17 ENCOUNTER — Telehealth (HOSPITAL_BASED_OUTPATIENT_CLINIC_OR_DEPARTMENT_OTHER): Payer: Self-pay | Admitting: Emergency Medicine

## 2014-03-17 NOTE — Progress Notes (Signed)
ED Antimicrobial Stewardship Positive Culture Follow Up   Stacy Garrett is an 63 y.o. female who [facility] with a chief complaint of  Chief Complaint  Patient presents with  . Allergic Reaction    Recent Results (from the past 720 hour(s))  URINE CULTURE     Status: None   Collection Time    03/12/14  7:23 PM      Result Value Ref Range Status   Specimen Description URINE, CLEAN CATCH   Final   Special Requests NONE   Final   Culture  Setup Time     Final   Value: 03/13/2014 08:32     Performed at [facility]   Colony Count     Final   Value: >=100,000 COLONIES/ML     [facility]formed at [facility]   Culture     Final   Value: ENTEROBACTER AEROGENES     Performed at [facility]   Report Status 03/16/2014 FINAL   Final   Organism ID, Bacteria ENTEROBACTER AEROGENES   Final    [x]  Treated with cephalexin, organism resistant to prescribed antimicrobial []  Patient discharged originally without antimicrobial agent and treatment is now indicated  New antibiotic prescription: ciprofloxacin 500mg  BID x 7 days  ED Provider: Trixie DredgeEmily West, PA-C   Mickeal SkinnerFrens, Korrie Hofbauer John 03/17/2014, 9:12 AM Infectious Diseases Pharmacist Phone# [phone]

## 2014-03-17 NOTE — Telephone Encounter (Signed)
Post ED Visit - Positive Culture Follow-up: Successful Patient Follow-Up  Culture assessed and recommendations reviewed by: []  Wes Dulaney, Pharm.D., BCPS [x]  Celedonio MiyamotoJeremy Frens, Pharm.D., BCPS []  Georgina PillionElizabeth Martin, Pharm.D., BCPS []  Playita CortadaMinh Pham, [address] BoulevardPharm.D., BCPS, AAHIVP []  Estella HuskMichelle Turner, Pharm.D., BCPS, AAHIVP []  Red ChristiansSamson Lee, Pharm.D. []  Tennis Mustassie Stewart, [place]D.  Positive urine culture enterobacter  []  Patient discharged without antimicrobial prescription and treatment is now indicated [x]  Organism is resistant to prescribed ED discharge antimicrobial []  Patient with positive blood cultures  Changes discussed with ED provider: Trixie DredgeEmily West PA New antibiotic prescription:  Stop Keflex, start Ciprofloxacin 500mg  po bid x 7 days Called to   03/17/14 1455 left voicemail for callback   Berle MullMiller, Lynette Topete 03/17/2014, 2:53 PM

## 2019-07-19 ENCOUNTER — Ambulatory Visit: Payer: Self-pay | Attending: Internal Medicine

## 2019-07-19 DIAGNOSIS — Z23 Encounter for immunization: Secondary | ICD-10-CM | POA: Insufficient documentation

## 2019-07-19 NOTE — Progress Notes (Signed)
   Covid-19 Vaccination Clinic  Name:  Stacy Garrett    MRN: [id number] DOB: 11-04-1950  07/19/2019  Ms. Ground was observed post Covid-19 immunization for 15 minutes without incidence. She was provided with Vaccine Information Sheet and instruction to access the V-Safe system.   [name] was instructed to call 911 with any severe reactions post vaccine: Marland Kitchen Difficulty breathing  . Swelling of your face and throat  . A fast heartbeat  . A bad rash all over your body  . Dizziness and weakness    Immunizations Administered    Name Date Dose VIS Date Route   Pfizer COVID-19 Vaccine 07/19/2019  1:45 PM 0.3 mL 05/03/2019 Intramuscular   Manufacturer: [employer], [employer]   Lot: [id number]   NDC: [address]-6

## 2019-08-14 ENCOUNTER — Ambulatory Visit: Payer: Medicare HMO | Attending: Internal Medicine

## 2019-08-14 DIAGNOSIS — Z23 Encounter for immunization: Secondary | ICD-10-CM

## 2019-08-14 NOTE — Progress Notes (Signed)
   Covid-19 Vaccination Clinic  Name:  Stacy Garrett    MRN: [id number] DOB: [birth year]  08/14/2019  [name] was observed post Covid-19 immunization for 15 minutes without incident. She was provided with Vaccine Information Sheet and instruction to access the V-Safe system.   [name] was instructed to call 911 with any severe reactions post vaccine: Marland Kitchen Difficulty breathing  . Swelling of face and throat  . A fast heartbeat  . A bad rash all over body  . Dizziness and weakness   Immunizations Administered    Name Date Dose VIS Date Route   Pfizer COVID-19 Vaccine 08/14/2019 10:28 AM 0.3 mL 05/03/2019 Intramuscular   Manufacturer: [employer], [employer]   Lot: [id number]   NDC: [address]-6
